# Patient Record
Sex: Female | Born: 1940 | Race: White | Hispanic: No | Smoking: Current every day smoker
Health system: Southern US, Community
[De-identification: ages and names within clinical notes are randomized; demographics above are authoritative.]

## PROBLEM LIST (undated history)

## (undated) DIAGNOSIS — E785 Hyperlipidemia, unspecified: Secondary | ICD-10-CM

## (undated) DIAGNOSIS — K219 Gastro-esophageal reflux disease without esophagitis: Secondary | ICD-10-CM

## (undated) DIAGNOSIS — I10 Essential (primary) hypertension: Secondary | ICD-10-CM

## (undated) DIAGNOSIS — C50919 Malignant neoplasm of unspecified site of unspecified female breast: Secondary | ICD-10-CM

## (undated) HISTORY — DX: Hyperlipidemia, unspecified: E78.5

## (undated) HISTORY — PX: MASTECTOMY: SHX3

## (undated) HISTORY — DX: Malignant neoplasm of unspecified site of unspecified female breast: C50.919

## (undated) HISTORY — DX: Essential (primary) hypertension: I10

## (undated) HISTORY — DX: Gastro-esophageal reflux disease without esophagitis: K21.9

---

## 2005-07-21 ENCOUNTER — Ambulatory Visit: Payer: Self-pay | Admitting: Podiatry

## 2005-08-07 ENCOUNTER — Ambulatory Visit: Payer: Self-pay | Admitting: Internal Medicine

## 2006-07-24 ENCOUNTER — Ambulatory Visit: Payer: Self-pay | Admitting: Gastroenterology

## 2006-08-22 ENCOUNTER — Ambulatory Visit: Payer: Self-pay | Admitting: Gastroenterology

## 2006-10-10 ENCOUNTER — Ambulatory Visit: Payer: Self-pay | Admitting: Internal Medicine

## 2007-12-10 ENCOUNTER — Ambulatory Visit: Payer: Self-pay | Admitting: Family Medicine

## 2007-12-16 ENCOUNTER — Ambulatory Visit: Payer: Self-pay | Admitting: Family Medicine

## 2008-02-04 ENCOUNTER — Ambulatory Visit: Payer: Self-pay | Admitting: Specialist

## 2008-03-06 ENCOUNTER — Other Ambulatory Visit: Payer: Self-pay

## 2008-03-06 ENCOUNTER — Observation Stay: Payer: Self-pay | Admitting: Internal Medicine

## 2008-03-10 ENCOUNTER — Ambulatory Visit: Payer: Self-pay | Admitting: Internal Medicine

## 2009-02-09 ENCOUNTER — Ambulatory Visit: Payer: Self-pay | Admitting: Unknown Physician Specialty

## 2009-03-05 ENCOUNTER — Ambulatory Visit: Payer: Self-pay | Admitting: Nurse Practitioner

## 2009-03-09 ENCOUNTER — Ambulatory Visit: Payer: Self-pay | Admitting: Nurse Practitioner

## 2009-04-29 ENCOUNTER — Ambulatory Visit: Payer: Self-pay | Admitting: Family Medicine

## 2010-03-17 ENCOUNTER — Ambulatory Visit: Payer: Self-pay | Admitting: Internal Medicine

## 2010-05-11 ENCOUNTER — Ambulatory Visit: Payer: Self-pay | Admitting: Internal Medicine

## 2010-05-17 ENCOUNTER — Ambulatory Visit: Payer: Self-pay | Admitting: Internal Medicine

## 2010-05-24 ENCOUNTER — Ambulatory Visit: Payer: Self-pay | Admitting: Internal Medicine

## 2011-04-18 ENCOUNTER — Ambulatory Visit: Payer: Self-pay | Admitting: Internal Medicine

## 2011-04-20 ENCOUNTER — Ambulatory Visit: Payer: Self-pay | Admitting: Pain Medicine

## 2011-04-26 ENCOUNTER — Ambulatory Visit: Payer: Self-pay | Admitting: Pain Medicine

## 2011-05-11 ENCOUNTER — Ambulatory Visit: Payer: Self-pay | Admitting: General Surgery

## 2011-05-15 ENCOUNTER — Ambulatory Visit: Payer: Self-pay | Admitting: Oncology

## 2011-05-18 ENCOUNTER — Ambulatory Visit: Payer: Self-pay | Admitting: General Surgery

## 2011-05-22 LAB — PATHOLOGY REPORT

## 2011-05-26 ENCOUNTER — Ambulatory Visit: Payer: Self-pay | Admitting: Oncology

## 2011-06-20 ENCOUNTER — Emergency Department: Payer: Self-pay | Admitting: Emergency Medicine

## 2011-06-25 ENCOUNTER — Ambulatory Visit: Payer: Self-pay | Admitting: Oncology

## 2011-07-01 ENCOUNTER — Ambulatory Visit: Payer: Self-pay | Admitting: Family Medicine

## 2011-07-26 ENCOUNTER — Ambulatory Visit: Payer: Self-pay | Admitting: Oncology

## 2011-08-26 ENCOUNTER — Ambulatory Visit: Payer: Self-pay | Admitting: Oncology

## 2011-09-25 ENCOUNTER — Ambulatory Visit: Payer: Self-pay | Admitting: Oncology

## 2011-10-13 ENCOUNTER — Other Ambulatory Visit: Payer: Self-pay | Admitting: Internal Medicine

## 2011-10-18 LAB — CANCER ANTIGEN 27.29: CA 27.29: 24.1 U/mL (ref 0.0–38.6)

## 2011-10-26 ENCOUNTER — Ambulatory Visit: Payer: Self-pay | Admitting: Oncology

## 2011-10-27 ENCOUNTER — Other Ambulatory Visit: Payer: Self-pay | Admitting: Internal Medicine

## 2011-11-08 IMAGING — CR DG CHEST 2V
1 series · 2 of 2 positions shown · non-contrast
Comparison: none

REASON FOR EXAM: breast ca, htn
COMMENTS:

[Series 1: view not recorded · 0.17mm/px · 2 of 2 slices shown]
[im 1/2]
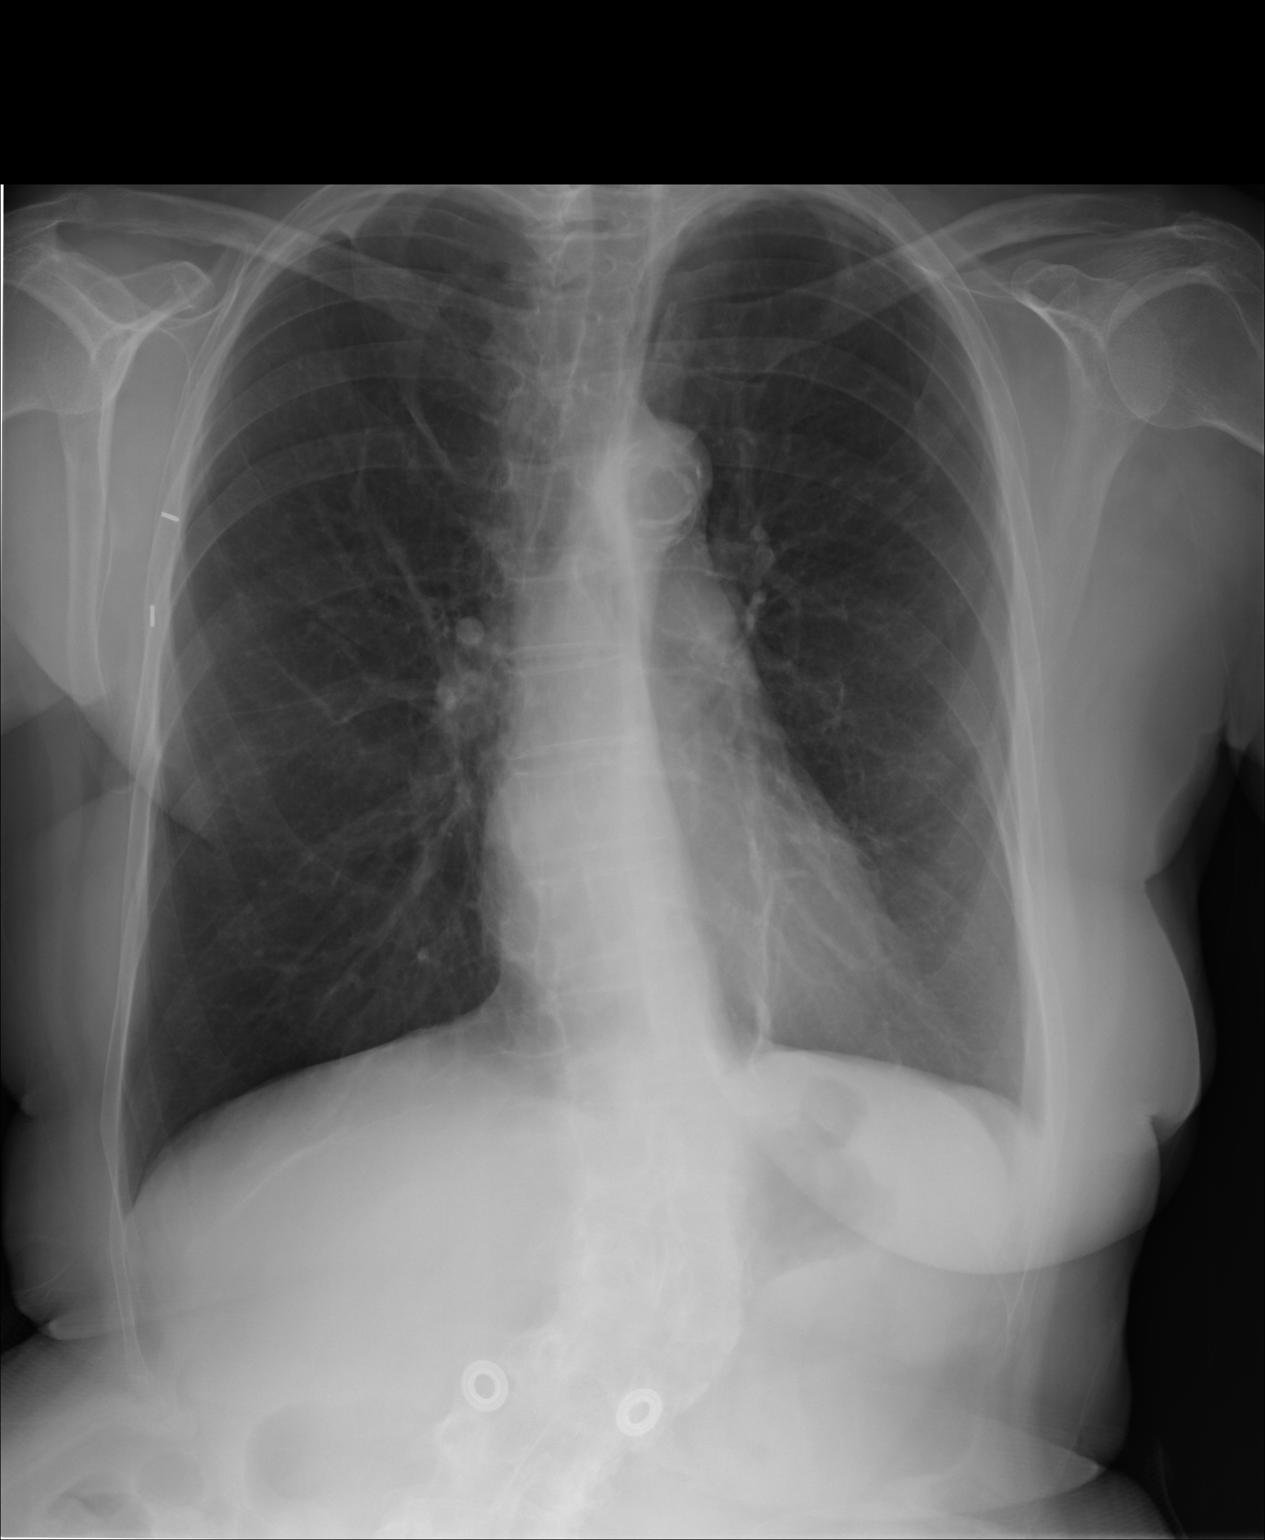
[im 2/2]
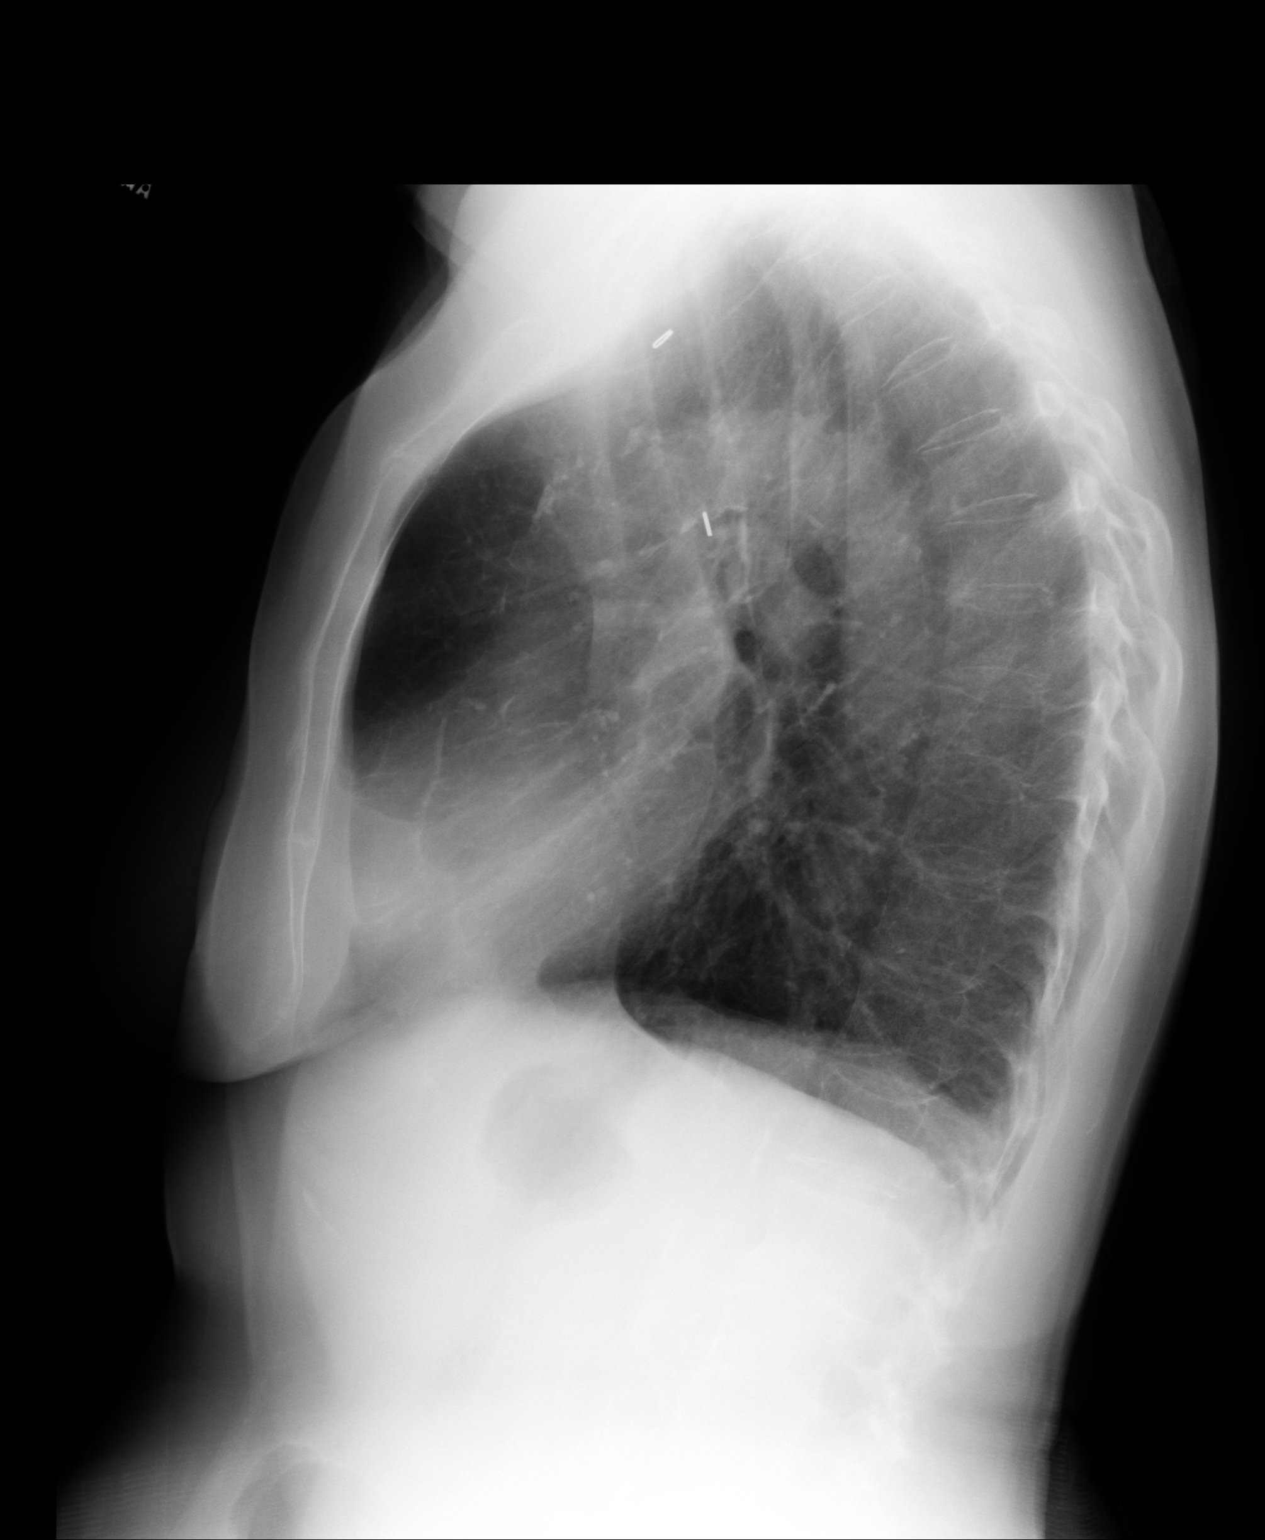

[2 of 2 positions shown; findings below may reference images not displayed]

PROCEDURE:     DXR - DXR CHEST PA (OR AP) AND LATERAL  - May 11, 2011 [DATE]

RESULT:     Comparison is made to the previous exam dated 03/06/2008.

There is a mild thoracic scoliosis concave to the left. The heart is normal
in size. There is mild hyperinflation. Atherosclerotic calcification is
present. There is no edema, infiltrate, effusion or mass. It appears the
patient may have undergone previous right mastectomy. Surgical clips are
seen in the right axilla.
IMPRESSION: COPD. Right mastectomy changes. No acute cardiopulmonary
disease evident. No focal mass evident within the pulmonary parenchyma.

## 2011-11-29 ENCOUNTER — Ambulatory Visit: Payer: Self-pay | Admitting: Oncology

## 2011-12-08 ENCOUNTER — Other Ambulatory Visit: Payer: Self-pay | Admitting: *Deleted

## 2011-12-08 MED ORDER — AMLODIPINE BESYLATE 10 MG PO TABS: 10.0000 mg | ORAL_TABLET | Freq: Every day | ORAL | Status: DC

## 2011-12-11 ENCOUNTER — Other Ambulatory Visit: Payer: Self-pay | Admitting: *Deleted

## 2011-12-11 MED ORDER — ROSUVASTATIN CALCIUM 5 MG PO TABS: 5.0000 mg | ORAL_TABLET | Freq: Every day | ORAL | Status: DC

## 2011-12-26 ENCOUNTER — Ambulatory Visit: Payer: Self-pay | Admitting: Oncology

## 2012-01-17 LAB — CBC CANCER CENTER
Basophil #: 0 x10 3/mm (ref 0.0–0.1)
Basophil %: 0.6 %
HCT: 33.3 % — ABNORMAL LOW (ref 35.0–47.0)
HGB: 11.6 g/dL — ABNORMAL LOW (ref 12.0–16.0)
Lymphocyte #: 1.4 x10 3/mm (ref 1.0–3.6)
MCV: 92 fL (ref 80–100)
Monocyte %: 9.6 %
Neutrophil #: 5.2 x10 3/mm (ref 1.4–6.5)
RDW: 14.5 % (ref 11.5–14.5)
WBC: 7.4 x10 3/mm (ref 3.6–11.0)

## 2012-01-17 LAB — COMPREHENSIVE METABOLIC PANEL
Albumin: 3.6 g/dL (ref 3.4–5.0)
Alkaline Phosphatase: 105 U/L (ref 50–136)
BUN: 16 mg/dL (ref 7–18)
Bilirubin,Total: 0.2 mg/dL (ref 0.2–1.0)
Chloride: 98 mmol/L (ref 98–107)
Creatinine: 0.84 mg/dL (ref 0.60–1.30)
EGFR (African American): >60
EGFR (Non-African Amer.): >60
SGPT (ALT): 22 U/L
Sodium: 137 mmol/L (ref 136–145)

## 2012-01-19 LAB — CANCER ANTIGEN 27.29: CA 27.29: 24.8 U/mL (ref 0.0–38.6)

## 2012-01-26 ENCOUNTER — Ambulatory Visit: Payer: Self-pay | Admitting: Oncology

## 2012-02-02 ENCOUNTER — Encounter: Payer: Self-pay | Admitting: Internal Medicine

## 2012-02-02 ENCOUNTER — Ambulatory Visit (INDEPENDENT_AMBULATORY_CARE_PROVIDER_SITE_OTHER): Payer: Self-pay | Admitting: Internal Medicine

## 2012-02-02 VITALS — BP 128/60 | HR 63 | Temp 97.8°F | Ht 64.0 in | Wt 124.0 lb

## 2012-02-02 DIAGNOSIS — I10 Essential (primary) hypertension: Secondary | ICD-10-CM

## 2012-02-02 DIAGNOSIS — J0100 Acute maxillary sinusitis, unspecified: Secondary | ICD-10-CM | POA: Insufficient documentation

## 2012-02-02 DIAGNOSIS — C50919 Malignant neoplasm of unspecified site of unspecified female breast: Secondary | ICD-10-CM | POA: Insufficient documentation

## 2012-02-02 MED ORDER — ROSUVASTATIN CALCIUM 5 MG PO TABS: 5.0000 mg | ORAL_TABLET | Freq: Every day | ORAL | Status: DC

## 2012-02-02 MED ORDER — SULFAMETHOXAZOLE-TRIMETHOPRIM 800-160 MG PO TABS: 1.0000 | ORAL_TABLET | Freq: Two times a day (BID) | ORAL | Status: DC

## 2012-02-02 MED ORDER — AMLODIPINE BESYLATE 10 MG PO TABS: 10.0000 mg | ORAL_TABLET | Freq: Every day | ORAL | Status: AC

## 2012-02-02 MED ORDER — OMEPRAZOLE 20 MG PO CPDR: 20.0000 mg | DELAYED_RELEASE_CAPSULE | Freq: Every day | ORAL | Status: AC

## 2012-02-02 MED ORDER — ATENOLOL-CHLORTHALIDONE 50-25 MG PO TABS: 1.0000 | ORAL_TABLET | Freq: Every day | ORAL | Status: AC

## 2012-02-02 NOTE — Assessment & Plan Note (Signed)
Patient status post left mastectomy in 2012. She also reports being treated with chemotherapy. We have not yet received records on this. Will request records from her oncologist.

## 2012-02-02 NOTE — Assessment & Plan Note (Signed)
Symptoms and exam consistent with right maxillary sinusitis. No improvement with azithromycin. Will try a course of Bactrim. Patient will call next week to get update. If no improvement, will plan to set up referral to ENT.

## 2012-02-02 NOTE — Assessment & Plan Note (Signed)
Blood pressure well-controlled. We'll continue current medications. Patient reports a recent lab work was normal. Will request this from her oncologist.

## 2012-02-02 NOTE — Progress Notes (Signed)
Subjective:    Patient ID: Kathleen Oconnell, female    DOB: 02/28/1941, 71 y.o.   MRN: [id number]  HPI 71 year old female with history of breast cancer, hypertension presents for followup. Her primary concern today is a 2 week history of gradually worsening right-sided facial pain, sinus pressure, and purulent nasal drainage. She also notes some chills but no fever. She reports occasional nonproductive cough. She denies any shortness of breath. She notes she was seen by her oncologist and treated with azithromycin. She reports no improvement with antibiotics. She has not been taking any other over-the-counter medications.  In regards to her recent history of breast cancer, she notes she is now status post mastectomy on the left. She previously had mastectomy on the right. She recently received chemotherapy and is now scheduled followup with her oncologist every 6 months. She is unsure what her chemotherapy regimen was.  In regards to her hypertension, she reports blood pressure has been well controlled on current medications. She notes that renal function was normal when checked by her oncologist last month. She denies  palpitations or chest pain.  Outpatient Encounter Prescriptions as of 02/02/2012  Medication Sig Dispense Refill  . amLODipine (NORVASC) 10 MG tablet Take 1 tablet (10 mg total) by mouth daily.  90 tablet  3  . Ascorbic Acid (VITAMIN C) 100 MG tablet Take 100 mg by mouth daily.      Marland Kitchen aspirin EC 81 MG tablet Take 81 mg by mouth daily.      Marland Kitchen atenolol-chlorthalidone (TENORETIC) 50-25 MG per tablet Take 1 tablet by mouth daily.  90 tablet  3  . calcium-vitamin D (CALCIUM + D) 250-125 MG-UNIT per tablet Take 1 tablet by mouth daily.      . Multiple Vitamins-Minerals (MULTIVITAMIN WITH MINERALS) tablet Take 1 tablet by mouth daily.      Marland Kitchen omeprazole (PRILOSEC) 20 MG capsule Take 1 capsule (20 mg total) by mouth daily.  90 capsule  3  . rosuvastatin (CRESTOR) 5 MG tablet Take 1 tablet (5  mg total) by mouth daily.  90 tablet  3  . sulfamethoxazole-trimethoprim (BACTRIM DS,SEPTRA DS) 800-160 MG per tablet Take 1 tablet by mouth 2 (two) times daily.  20 tablet  0    Review of Systems  Constitutional: Positive for chills. Negative for fever and unexpected weight change.  HENT: Positive for ear pain, congestion, rhinorrhea, postnasal drip and sinus pressure. Negative for hearing loss, nosebleeds, sore throat, facial swelling, sneezing, mouth sores, trouble swallowing, neck pain, neck stiffness, voice change, tinnitus and ear discharge.   Eyes: Negative for pain, discharge, redness and visual disturbance.  Respiratory: Positive for cough. Negative for chest tightness, shortness of breath, wheezing and stridor.   Cardiovascular: Negative for chest pain, palpitations and leg swelling.  Musculoskeletal: Negative for myalgias and arthralgias.  Skin: Negative for color change and rash.  Neurological: Negative for dizziness, weakness, light-headedness and headaches.  Hematological: Negative for adenopathy.       Objective:   Physical Exam  Constitutional: She is oriented to person, place, and time. She appears well-developed and well-nourished. No distress.  HENT:  Head: Normocephalic and atraumatic.  Right Ear: External ear normal. Tympanic membrane is not bulging. A middle ear effusion is present.  Left Ear: External ear normal. Tympanic membrane is not bulging. A middle ear effusion is present.  Nose: Mucosal edema present. Right sinus exhibits maxillary sinus tenderness and frontal sinus tenderness.  Mouth/Throat: Oropharynx is clear and moist. No oropharyngeal exudate.  Eyes:  Conjunctivae are normal. Pupils are equal, round, and reactive to light. Right eye exhibits no discharge. Left eye exhibits no discharge. No scleral icterus.  Neck: Normal range of motion. Neck supple. No tracheal deviation present. No thyromegaly present.  Cardiovascular: Normal rate, regular rhythm,  normal heart sounds and intact distal pulses.  Exam reveals no gallop and no friction rub.   No murmur heard. Pulmonary/Chest: Effort normal and breath sounds normal. No respiratory distress. She has no wheezes. She has no rales. She exhibits no tenderness.  Musculoskeletal: Normal range of motion. She exhibits no edema and no tenderness.  Lymphadenopathy:    She has no cervical adenopathy.  Neurological: She is alert and oriented to person, place, and time. No cranial nerve deficit. She exhibits normal muscle tone. Coordination normal.  Skin: Skin is warm and dry. No rash noted. She is not diaphoretic. No erythema. No pallor.  Psychiatric: She has a normal mood and affect. Her behavior is normal. Judgment and thought content normal.          Assessment & Plan:

## 2012-02-05 ENCOUNTER — Ambulatory Visit: Payer: Self-pay | Admitting: Internal Medicine

## 2012-02-07 ENCOUNTER — Telehealth: Payer: Self-pay | Admitting: *Deleted

## 2012-02-07 NOTE — Telephone Encounter (Signed)
I spoke w/pt - she c/o nausea after taking abx. Started a couple days ago. Pt took 1/2 tab this am and pm. She tolerated this w/no problems. Pt questions if original dose was "too high" for her. She has f/u on Friday and wanted to continue only taking 1/2 tab bid. I advise her for now that was ok and I would send message to MD to review tomorrow. Overall she is not feeling much better, she will keep f/u OV Friday.

## 2012-02-08 NOTE — Telephone Encounter (Signed)
Patient notified

## 2012-02-08 NOTE — Telephone Encounter (Signed)
That would be fine for her to continue half tab bid.

## 2012-02-08 NOTE — Telephone Encounter (Signed)
Left message asking patient to call the office.  

## 2012-02-09 ENCOUNTER — Ambulatory Visit (INDEPENDENT_AMBULATORY_CARE_PROVIDER_SITE_OTHER): Payer: Medicare Other | Admitting: Internal Medicine

## 2012-02-09 ENCOUNTER — Encounter: Payer: Self-pay | Admitting: Internal Medicine

## 2012-02-09 VITALS — BP 112/69 | HR 90 | Temp 98.3°F | Wt 124.0 lb

## 2012-02-09 DIAGNOSIS — E039 Hypothyroidism, unspecified: Secondary | ICD-10-CM

## 2012-02-09 DIAGNOSIS — J329 Chronic sinusitis, unspecified: Secondary | ICD-10-CM

## 2012-02-09 LAB — COMPREHENSIVE METABOLIC PANEL
Albumin: 4 g/dL (ref 3.5–5.2)
Alkaline Phosphatase: 86 U/L (ref 39–117)
CO2: 27 mEq/L (ref 19–32)
Calcium: 9.7 mg/dL (ref 8.4–10.5)
Chloride: 95 mEq/L — ABNORMAL LOW (ref 96–112)
Glucose, Bld: 86 mg/dL (ref 70–99)
Potassium: 4.5 mEq/L (ref 3.5–5.1)
Sodium: 132 mEq/L — ABNORMAL LOW (ref 135–145)
Total Protein: 7.3 g/dL (ref 6.0–8.3)

## 2012-02-09 LAB — CBC WITH DIFFERENTIAL/PLATELET
Eosinophils Relative: 1.3 % (ref 0.0–5.0)
Lymphocytes Relative: 16 % (ref 12.0–46.0)
Monocytes Absolute: 0.6 10*3/uL (ref 0.1–1.0)
Monocytes Relative: 7.7 % (ref 3.0–12.0)
Neutrophils Relative %: 74.4 % (ref 43.0–77.0)
Platelets: 330 10*3/uL (ref 150.0–400.0)
RBC: 3.82 Mil/uL — ABNORMAL LOW (ref 3.87–5.11)
WBC: 8.4 10*3/uL (ref 4.5–10.5)

## 2012-02-09 LAB — TSH: TSH: 0.61 u[IU]/mL (ref 0.35–5.50)

## 2012-02-09 MED ORDER — LEVOFLOXACIN 500 MG PO TABS: 500.0000 mg | ORAL_TABLET | Freq: Every day | ORAL | Status: AC

## 2012-02-09 NOTE — Progress Notes (Signed)
Subjective:    Patient ID: Kathleen Oconnell, female    DOB: [birth year], 71 y.o.   MRN: [id number]  HPI 71 year old female with a recent history of breast cancer status post mastectomy and chemotherapy presents for followup after recent episode of sinusitis. Last week, she was diagnosed with sinusitis and started on Bactrim. She had no improvement with this. She also been previously treated by her oncologist with azithromycin. She continues to have right-sided facial pressure, right ear pain, nasal congestion. She denies any productive cough or shortness of breath. She denies fever or chills. She feels generally fatigued. Aside from the Bactrim she has not been taking any other medications for this.  Outpatient Encounter Prescriptions as of 02/09/2012  Medication Sig Dispense Refill  . amLODipine (NORVASC) 10 MG tablet Take 1 tablet (10 mg total) by mouth daily.  90 tablet  3  . Ascorbic Acid (VITAMIN C) 100 MG tablet Take 100 mg by mouth daily.      Marland Kitchen aspirin EC 81 MG tablet Take 81 mg by mouth daily.      Marland Kitchen atenolol-chlorthalidone (TENORETIC) 50-25 MG per tablet Take 1 tablet by mouth daily.  90 tablet  3  . calcium-vitamin D (CALCIUM + D) 250-125 MG-UNIT per tablet Take 1 tablet by mouth daily.      . Multiple Vitamins-Minerals (MULTIVITAMIN WITH MINERALS) tablet Take 1 tablet by mouth daily.      Marland Kitchen omeprazole (PRILOSEC) 20 MG capsule Take 1 capsule (20 mg total) by mouth daily.  90 capsule  3  . rosuvastatin (CRESTOR) 5 MG tablet Take 1 tablet (5 mg total) by mouth daily.  90 tablet  3  . Travoprost, BAK Free, (TRAVATAN) 0.004 % SOLN ophthalmic solution 1 drop at bedtime.      Marland Kitchen DISCONTD: sulfamethoxazole-trimethoprim (BACTRIM DS,SEPTRA DS) 800-160 MG per tablet Take 1 tablet by mouth 2 (two) times daily.  20 tablet  0  . levofloxacin (LEVAQUIN) 500 MG tablet Take 1 tablet (500 mg total) by mouth daily.  7 tablet  0   Review of Systems  Constitutional: Positive for fatigue. Negative for  fever, chills and unexpected weight change.  HENT: Positive for ear pain and congestion. Negative for hearing loss, nosebleeds, sore throat, facial swelling, rhinorrhea, sneezing, mouth sores, trouble swallowing, neck pain, neck stiffness, voice change, postnasal drip, sinus pressure, tinnitus and ear discharge.   Eyes: Negative for pain, discharge, redness and visual disturbance.  Respiratory: Positive for cough. Negative for chest tightness, shortness of breath, wheezing and stridor.   Cardiovascular: Negative for chest pain, palpitations and leg swelling.  Musculoskeletal: Negative for myalgias and arthralgias.  Skin: Negative for color change and rash.  Neurological: Negative for dizziness, weakness, light-headedness and headaches.  Hematological: Negative for adenopathy.   BP 112/69  Pulse 90  Temp(Src) 98.3 F (36.8 C) (Oral)  Wt 124 lb (56.246 kg)  SpO2 98%     Objective:   Physical Exam  Constitutional: She is oriented to person, place, and time. She appears well-developed and well-nourished. No distress.  HENT:  Head: Normocephalic and atraumatic.  Right Ear: External ear normal.  Left Ear: External ear normal.  Nose: Nose normal.  Mouth/Throat: Oropharynx is clear and moist. No oropharyngeal exudate.  Eyes: Conjunctivae are normal. Pupils are equal, round, and reactive to light. Right eye exhibits no discharge. Left eye exhibits no discharge. No scleral icterus.  Neck: Normal range of motion. Neck supple. No tracheal deviation present. No thyromegaly present.  Cardiovascular: Normal rate, regular rhythm,  normal heart sounds and intact distal pulses.  Exam reveals no gallop and no friction rub.   No murmur heard. Pulmonary/Chest: Effort normal and breath sounds normal. No respiratory distress. She has no wheezes. She has no rales. She exhibits no tenderness.  Musculoskeletal: Normal range of motion. She exhibits no edema and no tenderness.  Lymphadenopathy:    She has no  cervical adenopathy.  Neurological: She is alert and oriented to person, place, and time. No cranial nerve deficit. She exhibits normal muscle tone. Coordination normal.  Skin: Skin is warm and dry. No rash noted. She is not diaphoretic. No erythema. No pallor.  Psychiatric: She has a normal mood and affect. Her behavior is normal. Judgment and thought content normal.          Assessment & Plan:

## 2012-02-09 NOTE — Assessment & Plan Note (Addendum)
Symptoms do not improve at all with use of Bactrim. Will try changing to Levaquin. We'll also check CBC and CMP with labs today, given fairly recent chemotherapy for breast cancer. Followup one week.

## 2012-02-15 ENCOUNTER — Ambulatory Visit (INDEPENDENT_AMBULATORY_CARE_PROVIDER_SITE_OTHER): Payer: Medicare Other | Admitting: Internal Medicine

## 2012-02-15 ENCOUNTER — Encounter: Payer: Self-pay | Admitting: Internal Medicine

## 2012-02-15 ENCOUNTER — Ambulatory Visit: Payer: Self-pay | Admitting: Internal Medicine

## 2012-02-15 VITALS — BP 142/60 | HR 75 | Temp 97.7°F | Wt 124.0 lb

## 2012-02-15 DIAGNOSIS — R51 Headache: Secondary | ICD-10-CM | POA: Insufficient documentation

## 2012-02-15 DIAGNOSIS — R519 Headache, unspecified: Secondary | ICD-10-CM | POA: Insufficient documentation

## 2012-02-15 LAB — COMPREHENSIVE METABOLIC PANEL
Alkaline Phosphatase: 121 U/L (ref 50–136)
Anion Gap: 10 (ref 7–16)
Bilirubin,Total: 0.3 mg/dL (ref 0.2–1.0)
Calcium, Total: 9.5 mg/dL (ref 8.5–10.1)
Chloride: 95 mmol/L — ABNORMAL LOW (ref 98–107)
Co2: 29 mmol/L (ref 21–32)
EGFR (African American): >60
SGOT(AST): 26 U/L (ref 15–37)
Sodium: 134 mmol/L — ABNORMAL LOW (ref 136–145)
Total Protein: 8.1 g/dL (ref 6.4–8.2)

## 2012-02-15 LAB — CBC CANCER CENTER
Basophil %: 0.2 %
Eosinophil %: 1.5 %
HCT: 37.4 % (ref 35.0–47.0)
HGB: 12.8 g/dL (ref 12.0–16.0)
Lymphocyte #: 1.4 x10 3/mm (ref 1.0–3.6)
Lymphocyte %: 17.8 %
MCHC: 34.2 g/dL (ref 32.0–36.0)
Monocyte #: 0.5 x10 3/mm (ref 0.0–0.7)
Monocyte %: 6.3 %
Neutrophil #: 5.9 x10 3/mm (ref 1.4–6.5)
Platelet: 374 x10 3/mm (ref 150–440)

## 2012-02-15 MED ORDER — HYDROCODONE-ACETAMINOPHEN 5-500 MG PO TABS: 1.0000 | ORAL_TABLET | Freq: Three times a day (TID) | ORAL | Status: AC | PRN

## 2012-02-15 NOTE — Progress Notes (Signed)
Subjective:    Patient ID: Kathleen Oconnell, female    DOB: 1941/08/04, 70 y.o.   MRN: [id number]  HPI 70YO female with recent history of recurrent breast CA presents to follow up headache which has been persistent for 3-4 weeks.  Was initially treated as sinusitis by her oncologist with azithromycin.  No improvement, so treated with Bactrim, again with no improvement.  Changed to Levaquin, but symptoms have persistent. Pt c/o right sided headache pain, and right ear pain.  No fever, chills. No nasal drainage. No cough. Labs performed at last visit including CBC and CMP were normal.  Pt has been taking ES Tylenol with no improvement in her symptoms.  Outpatient Encounter Prescriptions as of 02/15/2012  Medication Sig Dispense Refill  . amLODipine (NORVASC) 10 MG tablet Take 1 tablet (10 mg total) by mouth daily.  90 tablet  3  . Ascorbic Acid (VITAMIN C) 100 MG tablet Take 100 mg by mouth daily.      Marland Kitchen aspirin EC 81 MG tablet Take 81 mg by mouth daily.      Marland Kitchen atenolol-chlorthalidone (TENORETIC) 50-25 MG per tablet Take 1 tablet by mouth daily.  90 tablet  3  . calcium-vitamin D (CALCIUM + D) 250-125 MG-UNIT per tablet Take 1 tablet by mouth daily.      Marland Kitchen levofloxacin (LEVAQUIN) 500 MG tablet Take 1 tablet (500 mg total) by mouth daily.  7 tablet  0  . Multiple Vitamins-Minerals (MULTIVITAMIN WITH MINERALS) tablet Take 1 tablet by mouth daily.      Marland Kitchen omeprazole (PRILOSEC) 20 MG capsule Take 1 capsule (20 mg total) by mouth daily.  90 capsule  3  . rosuvastatin (CRESTOR) 5 MG tablet Take 1 tablet (5 mg total) by mouth daily.  90 tablet  3  . Travoprost, BAK Free, (TRAVATAN) 0.004 % SOLN ophthalmic solution 1 drop at bedtime.      Marland Kitchen HYDROcodone-acetaminophen (VICODIN) 5-500 MG per tablet Take 1 tablet by mouth every 8 (eight) hours as needed for pain.  60 tablet  0    Review of Systems  Constitutional: Negative for fever, chills and unexpected weight change.  HENT: Positive for ear pain. Negative  for hearing loss, nosebleeds, congestion, sore throat, facial swelling, rhinorrhea, sneezing, mouth sores, trouble swallowing, neck pain, neck stiffness, voice change, postnasal drip, sinus pressure, tinnitus and ear discharge.   Eyes: Negative for pain, discharge, redness and visual disturbance.  Respiratory: Negative for cough, chest tightness, shortness of breath, wheezing and stridor.   Cardiovascular: Negative for chest pain, palpitations and leg swelling.  Musculoskeletal: Negative for myalgias and arthralgias.  Skin: Negative for color change and rash.  Neurological: Positive for headaches. Negative for dizziness, weakness and light-headedness.  Hematological: Negative for adenopathy.       Objective:   Physical Exam  Constitutional: She is oriented to person, place, and time. She appears well-developed and well-nourished. No distress.  HENT:  Head: Normocephalic and atraumatic.  Right Ear: External ear normal.  Left Ear: External ear normal.  Nose: Nose normal.  Mouth/Throat: Oropharynx is clear and moist. No oropharyngeal exudate.  Eyes: Conjunctivae are normal. Pupils are equal, round, and reactive to light. Right eye exhibits no discharge. Left eye exhibits no discharge. No scleral icterus.  Neck: Normal range of motion. Neck supple. No tracheal deviation present. No thyromegaly present.  Cardiovascular: Normal rate, regular rhythm, normal heart sounds and intact distal pulses.  Exam reveals no gallop and no friction rub.   No murmur heard. Pulmonary/Chest:  Effort normal and breath sounds normal. No respiratory distress. She has no wheezes. She has no rales. She exhibits no tenderness.  Musculoskeletal: Normal range of motion. She exhibits no edema and no tenderness.  Lymphadenopathy:    She has no cervical adenopathy.  Neurological: She is alert and oriented to person, place, and time. No cranial nerve deficit. She exhibits normal muscle tone. Coordination normal.  Skin: Skin  is warm and dry. No rash noted. She is not diaphoretic. No erythema. No pallor.  Psychiatric: She has a normal mood and affect. Her behavior is normal. Judgment and thought content normal.          Assessment & Plan:

## 2012-02-15 NOTE — Assessment & Plan Note (Addendum)
Severe, persistent headache in patient with recent h/o breast CA. No physical exam findings to suggest sinusitis or otitis media after treatment with three courses of antibiotics.  Will get head CT to evaluate intracranial hemorrhage, sinusitis, versus metastatic disease.  If no obvious findings on non-contrast CT, then will get MRI brain.  Will use vicodin prn severe pain. Will call pt with CT results and further instructions.

## 2012-02-16 ENCOUNTER — Ambulatory Visit: Payer: Medicare Other | Admitting: Internal Medicine

## 2012-02-21 ENCOUNTER — Encounter: Payer: Self-pay | Admitting: Internal Medicine

## 2012-02-22 ENCOUNTER — Ambulatory Visit: Payer: Medicare Other | Admitting: Internal Medicine

## 2012-02-23 ENCOUNTER — Ambulatory Visit: Payer: Self-pay | Admitting: Oncology

## 2012-02-27 ENCOUNTER — Ambulatory Visit: Payer: Self-pay | Admitting: Oncology

## 2012-02-28 LAB — BASIC METABOLIC PANEL
Anion Gap: 11 (ref 7–16)
BUN: 36 mg/dL — ABNORMAL HIGH (ref 7–18)
Chloride: 94 mmol/L — ABNORMAL LOW (ref 98–107)
Co2: 28 mmol/L (ref 21–32)
Creatinine: 0.96 mg/dL (ref 0.60–1.30)
Potassium: 3.4 mmol/L — ABNORMAL LOW (ref 3.5–5.1)
Sodium: 133 mmol/L — ABNORMAL LOW (ref 136–145)

## 2012-03-07 ENCOUNTER — Encounter: Payer: Self-pay | Admitting: Internal Medicine

## 2012-03-18 ENCOUNTER — Other Ambulatory Visit: Payer: Self-pay | Admitting: *Deleted

## 2012-03-18 LAB — COMPREHENSIVE METABOLIC PANEL
Albumin: 3.3 g/dL — ABNORMAL LOW (ref 3.4–5.0)
Alkaline Phosphatase: 106 U/L (ref 50–136)
Anion Gap: 10 (ref 7–16)
Bilirubin,Total: 0.4 mg/dL (ref 0.2–1.0)
Co2: 32 mmol/L (ref 21–32)
Creatinine: 0.93 mg/dL (ref 0.60–1.30)
EGFR (African American): >60
EGFR (Non-African Amer.): >60
Osmolality: 265 (ref 275–301)
Potassium: 2.4 mmol/L — CL (ref 3.5–5.1)
SGOT(AST): 19 U/L (ref 15–37)
Sodium: 129 mmol/L — ABNORMAL LOW (ref 136–145)
Total Protein: 6.9 g/dL (ref 6.4–8.2)

## 2012-03-18 LAB — CBC CANCER CENTER
Basophil #: 0 x10 3/mm (ref 0.0–0.1)
Basophil %: 0.2 %
Eosinophil #: 0 x10 3/mm (ref 0.0–0.7)
Eosinophil %: 0.5 %
HCT: 31.6 % — ABNORMAL LOW (ref 35.0–47.0)
HGB: 11 g/dL — ABNORMAL LOW (ref 12.0–16.0)
Lymphocyte #: 1.1 x10 3/mm (ref 1.0–3.6)
MCV: 93 fL (ref 80–100)
Monocyte #: 0.6 x10 3/mm (ref 0.0–0.7)
Neutrophil #: 4.9 x10 3/mm (ref 1.4–6.5)
RBC: 3.41 10*6/uL — ABNORMAL LOW (ref 3.80–5.20)

## 2012-03-18 MED ORDER — ROSUVASTATIN CALCIUM 5 MG PO TABS: 5.0000 mg | ORAL_TABLET | Freq: Every day | ORAL | Status: AC

## 2012-03-25 ENCOUNTER — Inpatient Hospital Stay: Payer: Self-pay | Admitting: Oncology

## 2012-03-25 ENCOUNTER — Ambulatory Visit: Payer: Self-pay | Admitting: Oncology

## 2012-03-25 LAB — CBC WITH DIFFERENTIAL/PLATELET
Basophil #: 0 10*3/uL (ref 0.0–0.1)
Basophil %: 0.1 %
Eosinophil %: 0 %
HCT: 35 % (ref 35.0–47.0)
HGB: 12.1 g/dL (ref 12.0–16.0)
Lymphocyte #: 1.3 10*3/uL (ref 1.0–3.6)
Lymphocyte %: 7.7 %
MCH: 32.6 pg (ref 26.0–34.0)
MCV: 95 fL (ref 80–100)
Monocyte #: 1.5 10*3/uL — ABNORMAL HIGH (ref 0.0–0.7)
Neutrophil #: 14 10*3/uL — ABNORMAL HIGH (ref 1.4–6.5)
Neutrophil %: 83.4 %
Platelet: 464 10*3/uL — ABNORMAL HIGH (ref 150–440)
RDW: 14.6 % — ABNORMAL HIGH (ref 11.5–14.5)
WBC: 16.8 10*3/uL — ABNORMAL HIGH (ref 3.6–11.0)

## 2012-03-25 LAB — COMPREHENSIVE METABOLIC PANEL
Anion Gap: 10 (ref 7–16)
BUN: 26 mg/dL — ABNORMAL HIGH (ref 7–18)
Bilirubin,Total: 0.4 mg/dL (ref 0.2–1.0)
Calcium, Total: 8.9 mg/dL (ref 8.5–10.1)
Chloride: 85 mmol/L — ABNORMAL LOW (ref 98–107)
Co2: 31 mmol/L (ref 21–32)
EGFR (African American): >60
EGFR (Non-African Amer.): >60
Glucose: 134 mg/dL — ABNORMAL HIGH (ref 65–99)
Osmolality: 260 (ref 275–301)
Potassium: 3.1 mmol/L — ABNORMAL LOW (ref 3.5–5.1)
SGOT(AST): 34 U/L (ref 15–37)
Sodium: 126 mmol/L — ABNORMAL LOW (ref 136–145)

## 2012-03-26 LAB — BASIC METABOLIC PANEL
Chloride: 91 mmol/L — ABNORMAL LOW (ref 98–107)
Co2: 27 mmol/L (ref 21–32)
EGFR (African American): >60
Glucose: 102 mg/dL — ABNORMAL HIGH (ref 65–99)
Osmolality: 257 (ref 275–301)
Sodium: 128 mmol/L — ABNORMAL LOW (ref 136–145)

## 2012-03-26 LAB — PHENYTOIN LEVEL, TOTAL: Dilantin: 16.7 ug/mL (ref 10.0–20.0)

## 2012-03-27 LAB — BASIC METABOLIC PANEL
Anion Gap: 11 (ref 7–16)
BUN: 7 mg/dL (ref 7–18)
Calcium, Total: 7.9 mg/dL — ABNORMAL LOW (ref 8.5–10.1)
Co2: 25 mmol/L (ref 21–32)
EGFR (Non-African Amer.): >60
Glucose: 93 mg/dL (ref 65–99)
Osmolality: 249 (ref 275–301)
Potassium: 2.9 mmol/L — ABNORMAL LOW (ref 3.5–5.1)
Sodium: 125 mmol/L — ABNORMAL LOW (ref 136–145)

## 2012-03-27 LAB — CBC WITH DIFFERENTIAL/PLATELET
Basophil #: 0 10*3/uL (ref 0.0–0.1)
Basophil %: 0.2 %
Lymphocyte #: 1.3 10*3/uL (ref 1.0–3.6)
MCH: 32.3 pg (ref 26.0–34.0)
MCV: 95 fL (ref 80–100)
Monocyte #: 1.5 10*3/uL — ABNORMAL HIGH (ref 0.0–0.7)
Platelet: 352 10*3/uL (ref 150–440)
RBC: 3.6 10*6/uL — ABNORMAL LOW (ref 3.80–5.20)
RDW: 14.2 % (ref 11.5–14.5)
WBC: 17.3 10*3/uL — ABNORMAL HIGH (ref 3.6–11.0)

## 2012-03-28 LAB — BASIC METABOLIC PANEL
BUN: 15 mg/dL (ref 7–18)
Chloride: 92 mmol/L — ABNORMAL LOW (ref 98–107)
Co2: 25 mmol/L (ref 21–32)
Glucose: 99 mg/dL (ref 65–99)
Osmolality: 258 (ref 275–301)
Potassium: 3.6 mmol/L (ref 3.5–5.1)

## 2012-03-28 LAB — CBC WITH DIFFERENTIAL/PLATELET
Basophil %: 0 %
Eosinophil #: 0 10*3/uL (ref 0.0–0.7)
Eosinophil %: 0 %
Lymphocyte #: 0.7 10*3/uL — ABNORMAL LOW (ref 1.0–3.6)
Lymphocyte %: 5.3 %
MCH: 32.5 pg (ref 26.0–34.0)
Monocyte #: 0.9 10*3/uL — ABNORMAL HIGH (ref 0.0–0.7)
Neutrophil #: 10.7 10*3/uL — ABNORMAL HIGH (ref 1.4–6.5)
WBC: 12.3 10*3/uL — ABNORMAL HIGH (ref 3.6–11.0)

## 2012-03-28 LAB — PHENYTOIN LEVEL, TOTAL: Dilantin: 9.5 ug/mL — ABNORMAL LOW (ref 10.0–20.0)

## 2012-03-28 LAB — MAGNESIUM: Magnesium: 1.1 mg/dL — ABNORMAL LOW

## 2012-04-15 ENCOUNTER — Telehealth: Payer: Self-pay | Admitting: Internal Medicine

## 2012-04-15 NOTE — Telephone Encounter (Signed)
Kathleen Oconnell niece called to let know that she passed away Apr 24, 2012

## 2012-04-15 NOTE — Telephone Encounter (Signed)
We should send a sympathy card.

## 2012-04-16 NOTE — Telephone Encounter (Signed)
I have sent this to Raeanne Gathers. So she can prepare the card for [place] and then we will send it out.

## 2012-04-16 NOTE — Telephone Encounter (Signed)
Sympathy card mailed 

## 2012-04-22 ENCOUNTER — Other Ambulatory Visit: Payer: Self-pay | Admitting: Internal Medicine

## 2012-04-24 ENCOUNTER — Ambulatory Visit: Payer: Self-pay | Admitting: Oncology

## 2012-04-24 DEATH — deceased

## 2012-08-01 ENCOUNTER — Encounter: Payer: Self-pay | Admitting: Internal Medicine

## 2012-08-07 ENCOUNTER — Encounter: Payer: Self-pay | Admitting: Internal Medicine

## 2012-09-15 IMAGING — CT CT SIM MISC
1 series · 11 of 14 positions shown, 14 images · non-contrast
Comparison: none

[Series 2: — · axial · 0.98mm/px · z∈[-670,-456]mm · 11 of 94 slices shown, 14 images]
[im 8/94  soft-tissue]
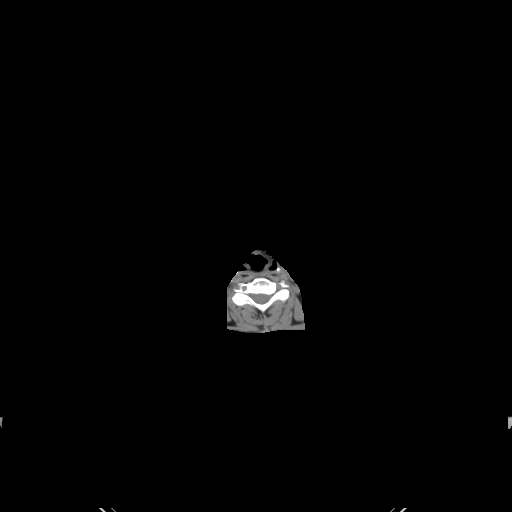
[im 8/94  bone]
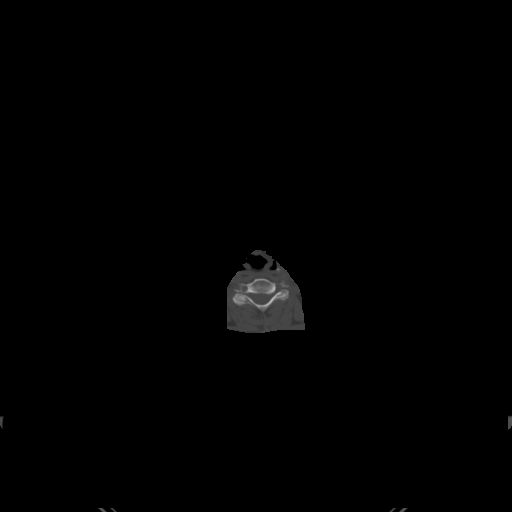
[im 15/94  bone]
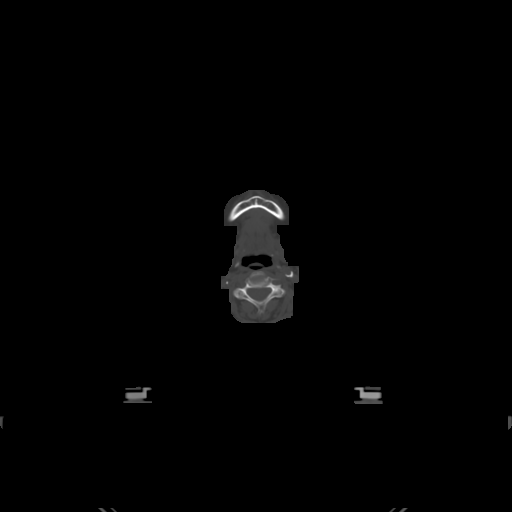
[im 22/94  bone]
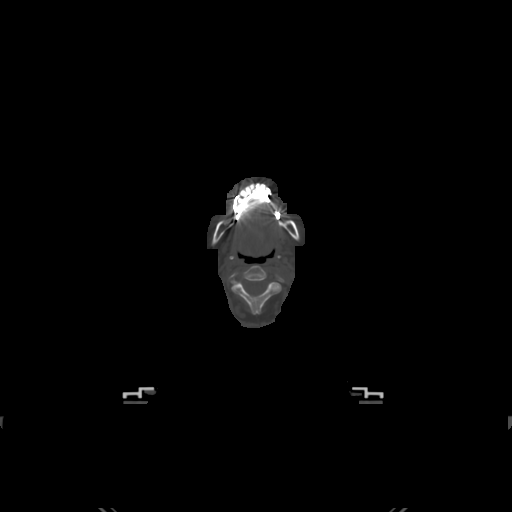
[im 29/94  bone]
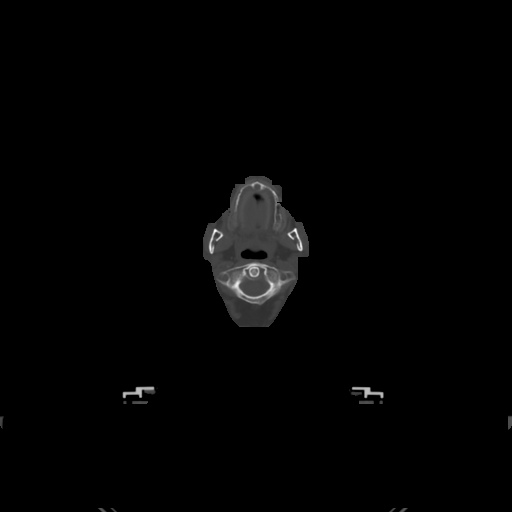
[im 36/94  soft-tissue]
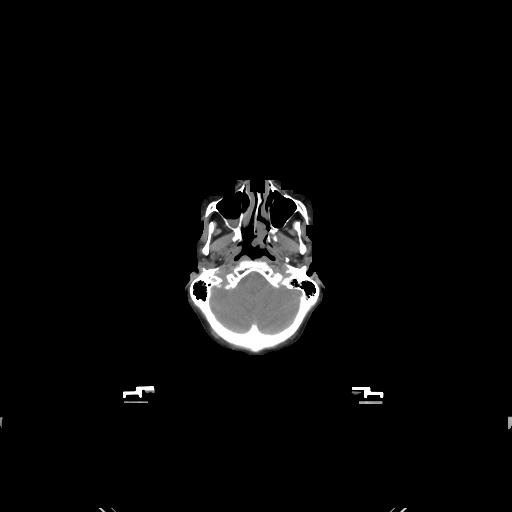
[im 36/94  bone]
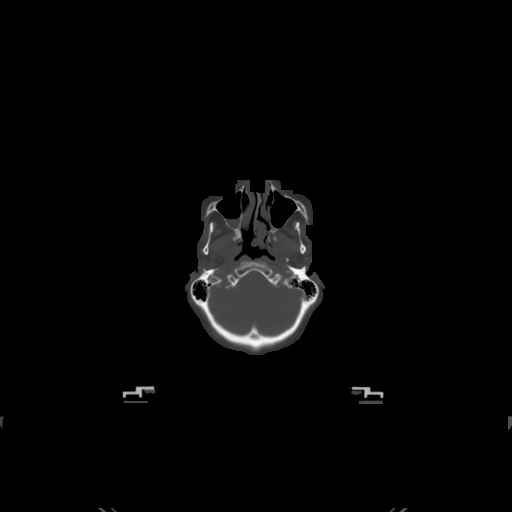
[im 43/94  bone]
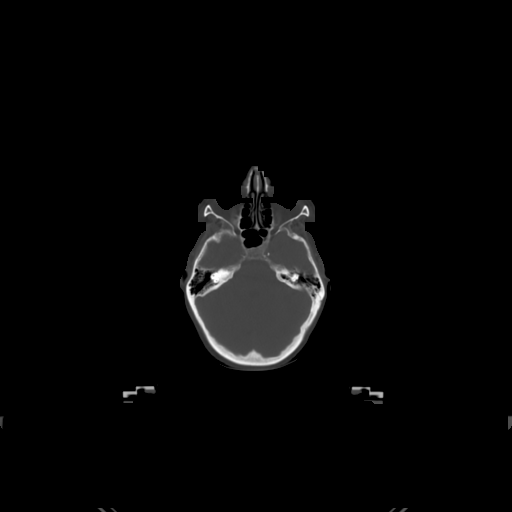
[im 51/94  bone]
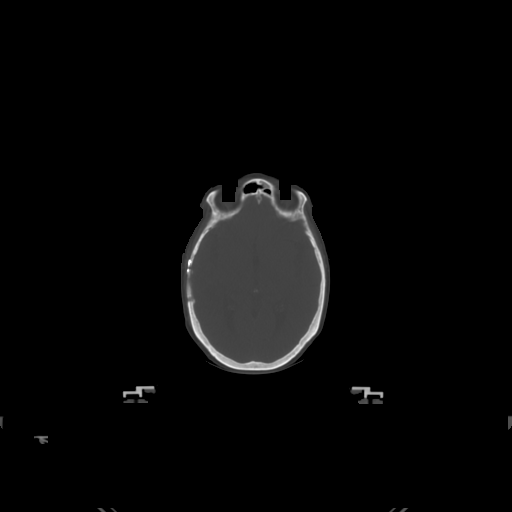
[im 58/94  bone]
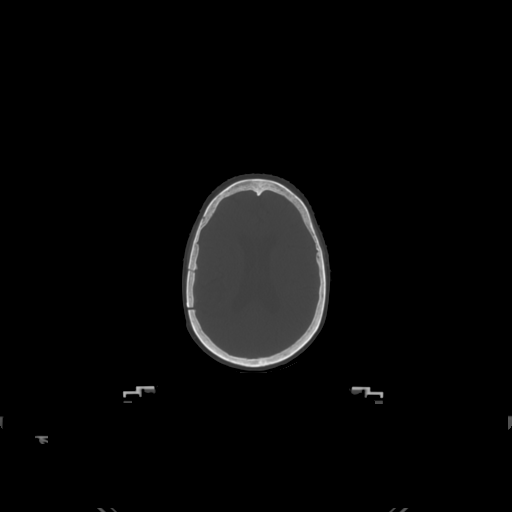
[im 65/94  soft-tissue]
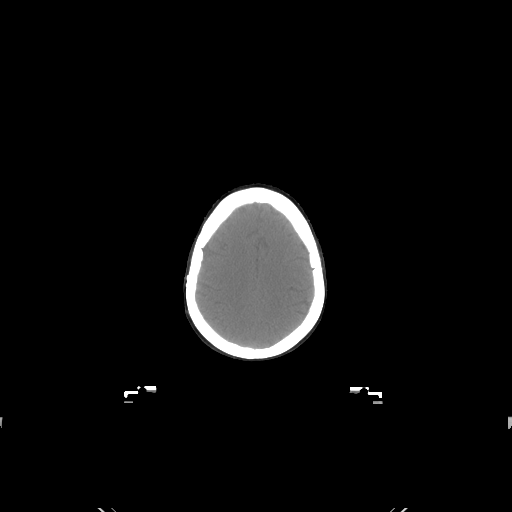
[im 65/94  bone]
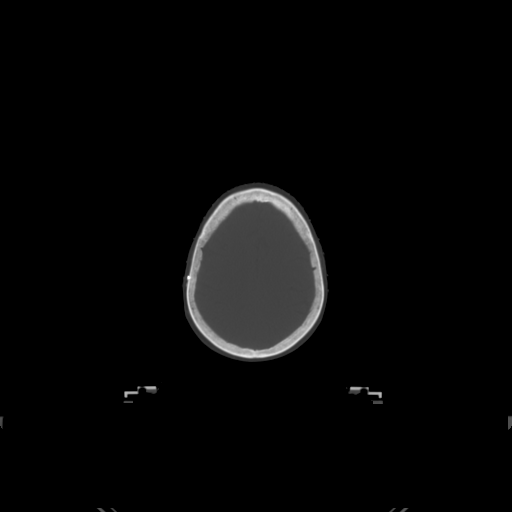
[im 72/94  bone]
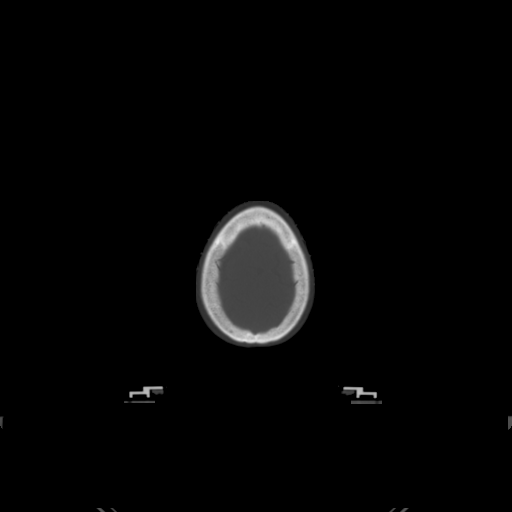
[im 79/94  bone]
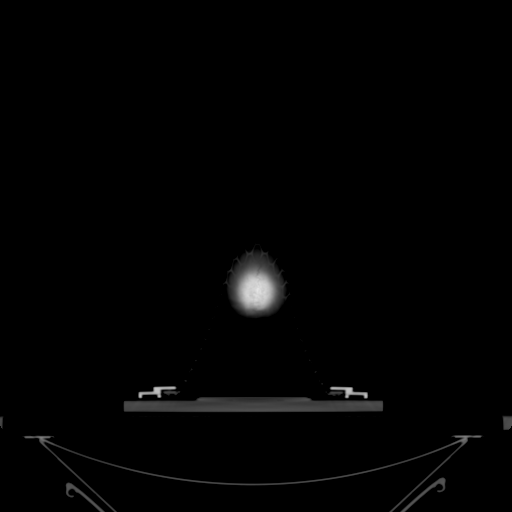

[11 of 14 positions shown; findings below may reference images not displayed]

IMAGES IMPORTED FROM THE SYNGO WORKFLOW SYSTEM
NO DICTATION FOR STUDY

## 2015-04-18 NOTE — Consult Note (Signed)
PATIENT NAME:  Kathleen Oconnell, Kathleen Oconnell MR#:  [id number] DATE OF BIRTH:  Jul 19, 1941  DATE OF CONSULTATION:  03/25/2012  REFERRING PHYSICIAN:  Forest Gleason, MD CONSULTING PHYSICIAN:  Rudell Cobb. Loletta Specter, MD  HISTORY: Kathleen Oconnell is a 74 year old right-handed white patient of Dr. Oliva Bustard with history of hypertension, glaucoma, chronic one pack per day smoking, history of breast cancer, invasive ductal carcinoma, metastatic to brain, now status post [facility] resection of right temporoparietal mass found on brain CT scan and MRI scan 02/15/2012 on workup of headache. She is now admitted 03/25/2012 and is referred for evaluation of possible seizures in the setting of recent problems with tremors and episode of abnormal eye movements. History comes from the patient's family, her hospital chart, hospital records, and [facility] admission History and physical from Dr. Oliva Bustard.   The patient was brought to hospital for admission on 03/25/2012 for failure to thrive and episode of possible seizure when she was brought to the hospital by family for brain radiation treatment, and, on being lifted from the car to the wheelchair, became unresponsive with eyes rolled up and heart rate reported as alarming when checked by Dr. Baruch Gouty, per the family. She had no reported associated tonic posturing, limbs jerking, or bites of tongue or lip, but the family reports history in the weeks prior to admission of development of tremor of the hands, noted first in the right and then the left also along with overall functional decline during these weeks with weakness of the left leg, worsened back pain (described as "excruciating" per the patient to the daughter on the day of admission) with increasing need for pain medication, associated with increased sleepiness during the day, decreased appetite, weight loss, use of walker beginning a couple of weeks after [facility] brain tumor resection, and subsequent inability to walk the week prior to admission,  and also recent onset of impairment of vision for the left eye. On admission she was started on antiseizure treatment with phenytoin. The family reports that she was on a one-week course of antiseizure medicine at the time of discharge at [facility] after resection of brain mass.   PHYSICAL EXAMINATION: The patient is a thin, elderly white woman who is examined lying semisupine, in no acute distress, blood pressure 165/65 with heart rate 64. There is no fever. There was scar and palpable craniotomy margin of the right skull and scalp. She was lethargic after receiving medication for pain, notable for mild tremor at times of her hands. She was arousable to answer some questions with impaired speech. She endorsed significant back pain. On cranial nerve examination, eye movements were normal and visual fields were full to finger count for the right eye, but not the left. She had grossly normal and symmetric strength in the upper extremities. Her strength in the right lower extremity was greater than or equal to four out of five proximally and distally. Strength of the left lower extremity was 3-/5. She was seen to have mild action tremor with finger-to-nose testing bilaterally.   IMPRESSION: Clinical picture of recently accelerated debilitated state in a patient with metastatic breast cancer, apparently most bothered by back pain, suspected associated with a bony metastasis, "excruciating" back pain and with associated left leg weakness. I suspect her hands tremor is secondary to pain and that her spell on the day of admission was likely syncope when moved from car to wheelchair. She does have risk of seizures in the setting of brain metastases and having had surgery.   RECOMMENDATIONS:  1. I agree with her present work-up and treatment in the hospital including initiation of antiseizure medicine. Recommend that she be continued on phenytoin, with levels aiming for high teens.  2. Brain radiation therapy as per Dr.  Donella Stade.  3. 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 Donella Stade.   I appreciate being asked to see this pleasant and unfortunate lady.   ____________________________ Rudell Cobb. Loletta Specter, MD prc:ap D: 03/26/2012 08:47:59 ET T: 03/26/2012 11:59:23 ET JOB#: [id number]  cc: Rudell Cobb. Loletta Specter, MD, <Dictator> Linton Flemings MD ELECTRONICALLY SIGNED 04/17/2012 10:57

## 2015-04-18 NOTE — Discharge Summary (Signed)
PATIENT NAME:  Kathleen Oconnell, Kathleen Oconnell MR#:  [id number] DATE OF BIRTH:  05/08/1941  DATE OF ADMISSION:  03/25/2012 DATE OF DISCHARGE:  03/29/2012  TYPE OF DISCHARGE: The patient was discharged to Hospice home.   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 The patient had undergone resection. The patient's condition has been declining, progressive weakness in the right upper and lower extremities. The patient was admitted to the hospital because of rapidly declining condition, possibility of seizure episode, syncopal attack. The patient fell in a parking lot and there was witnessed seizure activity.   Past history, social and family history have been dictated and noted on the chart.   AVAILABLE LAB DATA AND X-RAY DATA: On 04/03 white count was 17.3, hemoglobin was 11.6, platelet count was 352,000. Dilantin level was obtained multiple times. Magnesium level was low. The patient had EEG which was suggestive of seizure activity.   HOSPITAL COURSE: During the hospital stay the patient was started on intravenous Dilantin and was switched over to p.o. Dilantin. Neurological evaluation was done. EEG was suggestive of seizure activity. The patient also had hyponatremia. Initially normal saline was given but sodium remained low in the 120's so hypertonic saline was given. Radiation to the brain was continued. Decadron was continued. The patient's condition did not improve significantly. Neurological weakness persisted. In view of all these findings, family decided to stop radiation therapy and take approach for palliative care and decided to transfer the patient to Hospice home for end-of-life care.   DISCHARGE MEDICATIONS:  1. Potassium 20 mEq p.o. daily.  2. Decadron 4 mg two tablets once a day.  3. Vicodin 1 tablet q.4 to 6 hours p.r.n. for pain.  4. Remeron 15 mg p.o. at  bedtime for sleep.  5. Celexa 20 mg p.o. daily.  6. Lactulose 10 grams per 15 mL q.4 to 6 hours p.r.n.  7. Prilosec 20 mg p.o. daily.  8. Dilantin 200 mg twice a day.  9. Roxanol 20 mg/mL every hour as needed for pain.  10. Xanax 0.25 mg p.o. t.i.d. p.r.n. for anxiety.   FINAL DIAGNOSES:  1. 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  2. 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  3. Hyponatremia secondary to metastatic disease to brain.  4. 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   OVERALL PROGNOSIS: Poor.   ____________________________ Kathleen Lee. Kosha Jaquith, MD [id number] D: 04/05/2012 15:28:50 ET T: 04/08/2012 08:50:57 ET JOB#: [id number]  cc: Kathleen Shiner K. Oliva Bustard, MD, <Dictator> Kathleen Gibbon MD ELECTRONICALLY SIGNED 04/08/2012 15:03
